# Patient Record
Sex: Female | Born: 1965 | Race: White | Hispanic: No | Marital: Married | Smoking: Never smoker
Health system: Southern US, Community
[De-identification: ages and names within clinical notes are randomized; demographics above are authoritative.]

## PROBLEM LIST (undated history)

## (undated) DIAGNOSIS — E039 Hypothyroidism, unspecified: Secondary | ICD-10-CM

## (undated) DIAGNOSIS — E785 Hyperlipidemia, unspecified: Secondary | ICD-10-CM

## (undated) DIAGNOSIS — R002 Palpitations: Secondary | ICD-10-CM

## (undated) HISTORY — DX: Hypothyroidism, unspecified: E03.9

## (undated) HISTORY — DX: Hyperlipidemia, unspecified: E78.5

## (undated) HISTORY — DX: Palpitations: R00.2

---

## 2002-01-04 ENCOUNTER — Encounter: Payer: Self-pay | Admitting: Obstetrics and Gynecology

## 2002-01-04 ENCOUNTER — Encounter: Admission: RE | Admit: 2002-01-04 | Discharge: 2002-01-04 | Payer: Self-pay | Admitting: Obstetrics and Gynecology

## 2005-07-18 ENCOUNTER — Encounter: Admission: RE | Admit: 2005-07-18 | Discharge: 2005-07-18 | Payer: Self-pay | Admitting: Obstetrics and Gynecology

## 2006-09-25 ENCOUNTER — Encounter: Admission: RE | Admit: 2006-09-25 | Discharge: 2006-09-25 | Payer: Self-pay | Admitting: Obstetrics and Gynecology

## 2008-03-29 IMAGING — MG MM DIGITAL SCREENING
4 series · 4 of 4 positions shown · non-contrast
Comparison: none

DG SCREEN MAMMOGRAM BILATERAL
Bilateral CC and MLO view(s) were taken.
Technologist: Will-Fried Yunzuguru
Prior study comparison: January 04, 2002, bilateral screening mammogram, performed at [facility] [REDACTED].

DIGITAL SCREENING MAMMOGRAM WITH CAD:
There are scattered fibroglandular densities.  There is no dominant mass, architectural distortion 
or calcification to suggest malignancy.

[R CC]
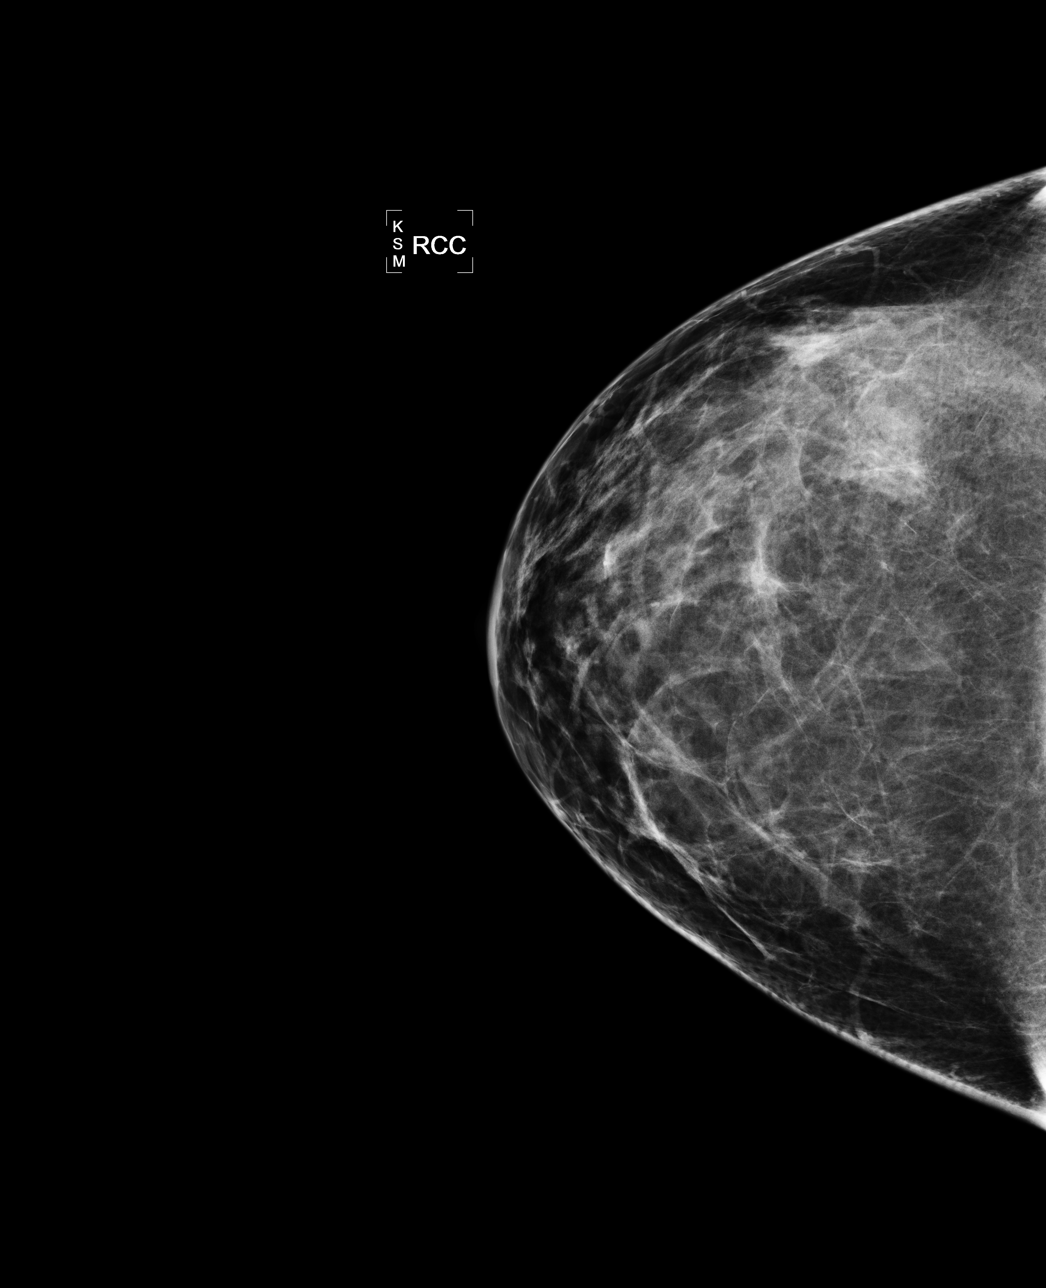

[L CC]
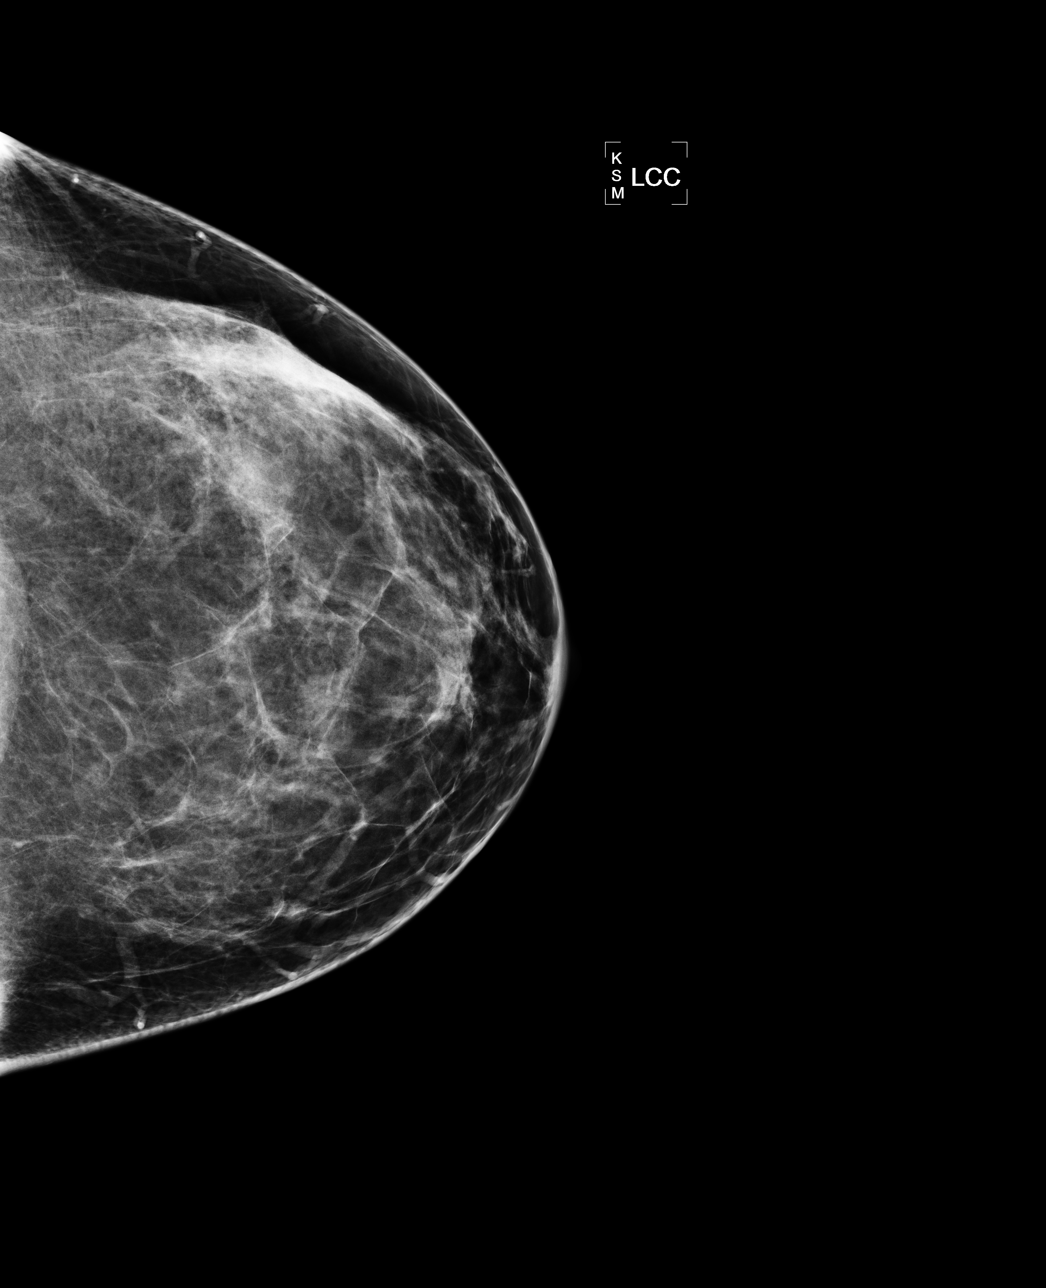

[L MLO]
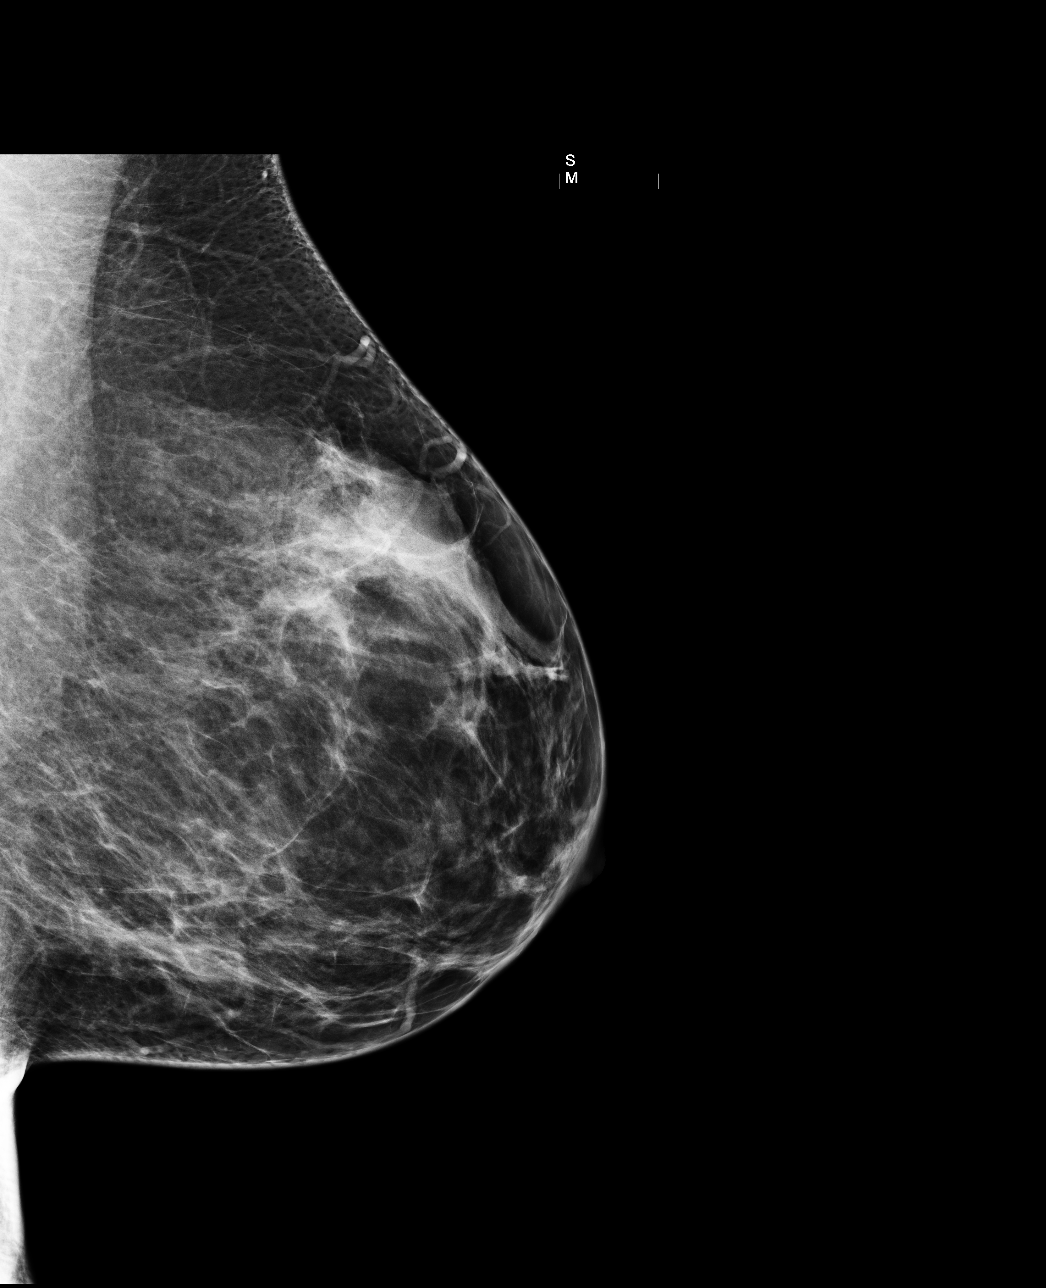

[R MLO]
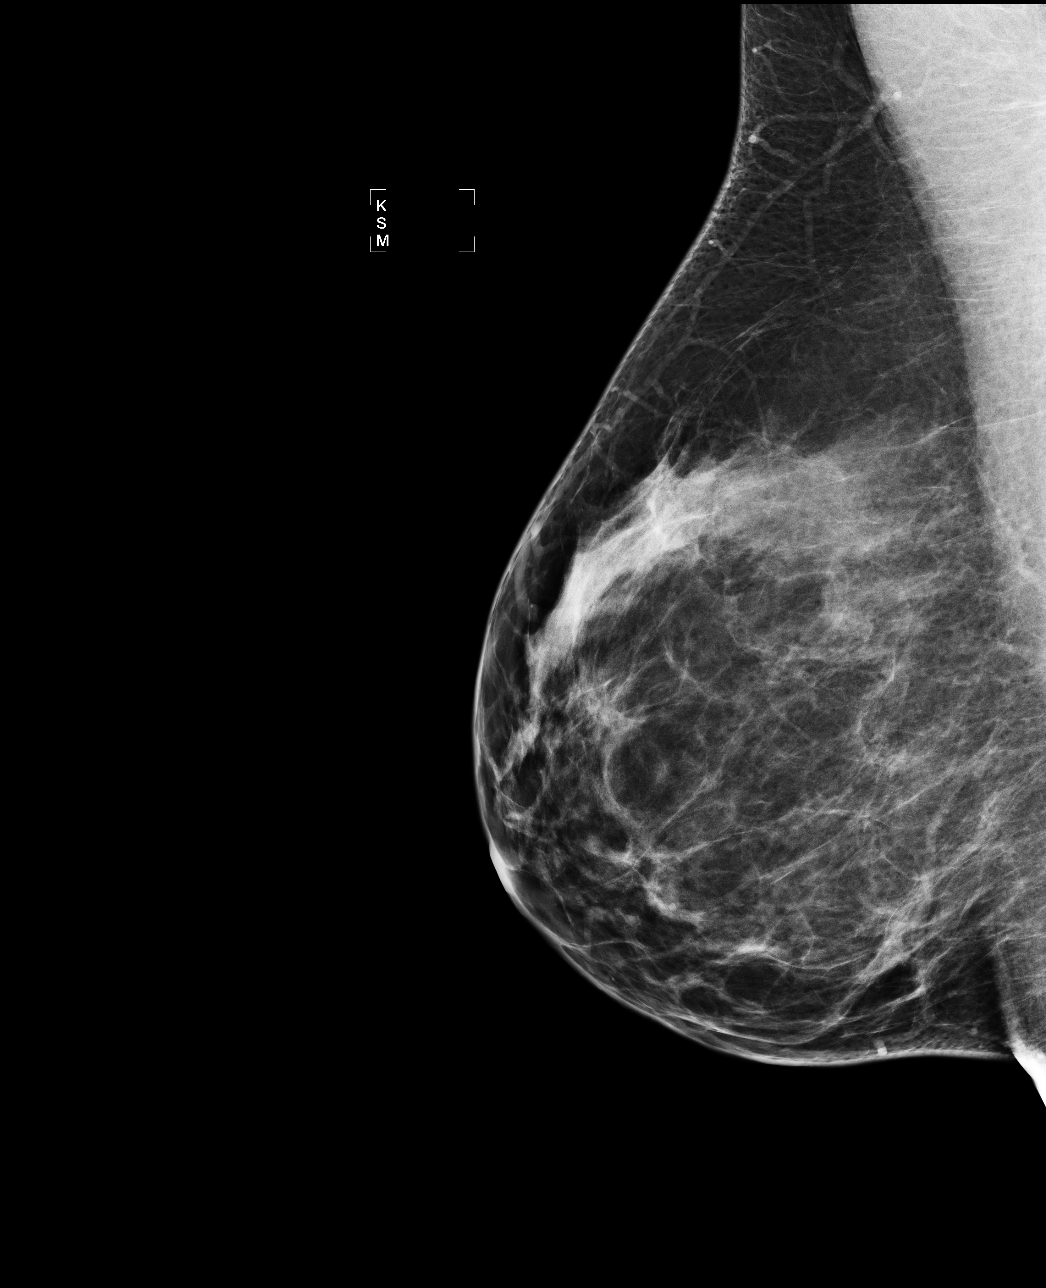

[4 of 4 positions shown; findings below may reference images not displayed]

IMPRESSION: No mammographic evidence of malignancy.  Suggest yearly screening mammography.

ASSESSMENT: Negative - BI-RADS 1

Screening mammogram in 1 year.
THIS WAS ANALAYZED BY COMPUTER AIDED DETECTION. , THIS PROCEDURE WAS A DIGITAL MAMMOGRAM.

## 2008-04-22 ENCOUNTER — Encounter: Admission: RE | Admit: 2008-04-22 | Discharge: 2008-04-22 | Payer: Self-pay | Admitting: Obstetrics and Gynecology

## 2009-10-25 IMAGING — MG MM DIAGNOSTIC UNILATERAL L
3 series · 3 of 3 positions shown · non-contrast
Comparison: 04/11/2008 [REDACTED], 09/25/2006

CLINICAL DATA: Screening callback for questioned left distortion

DIGITAL DIAGNOSTIC LEFT MAMMOGRAM WITH CAD

[L CC]
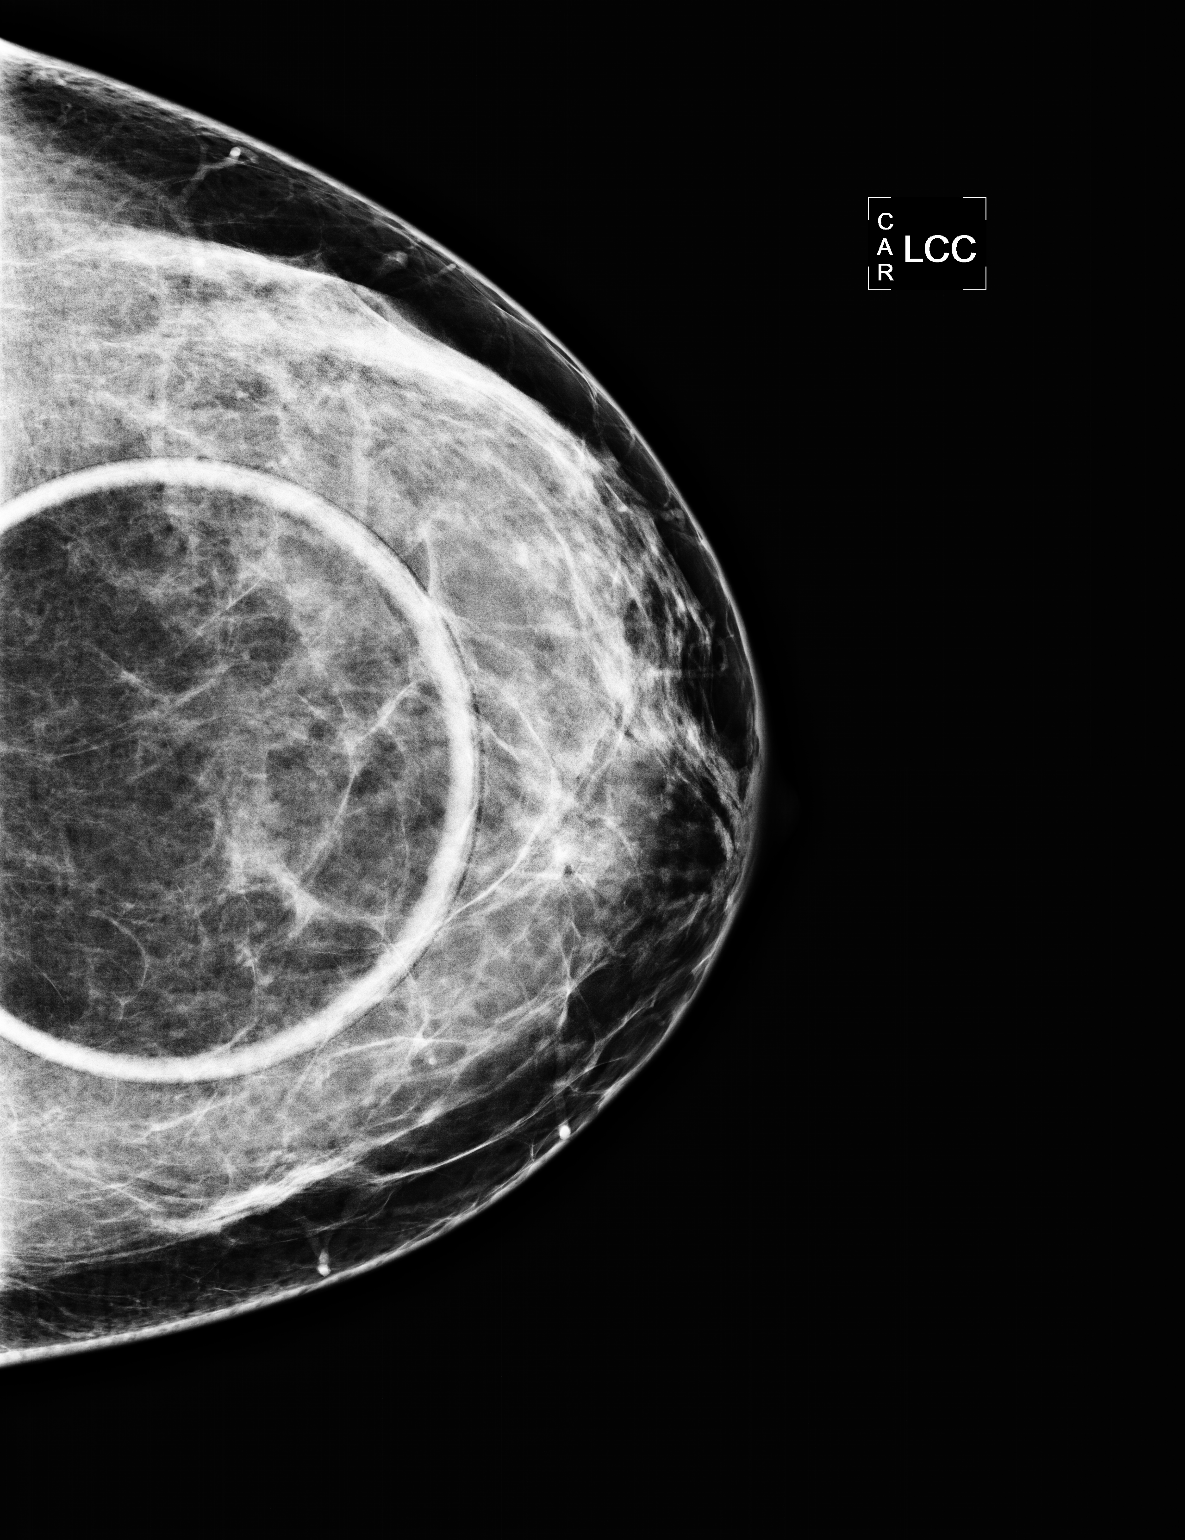

[L ML]
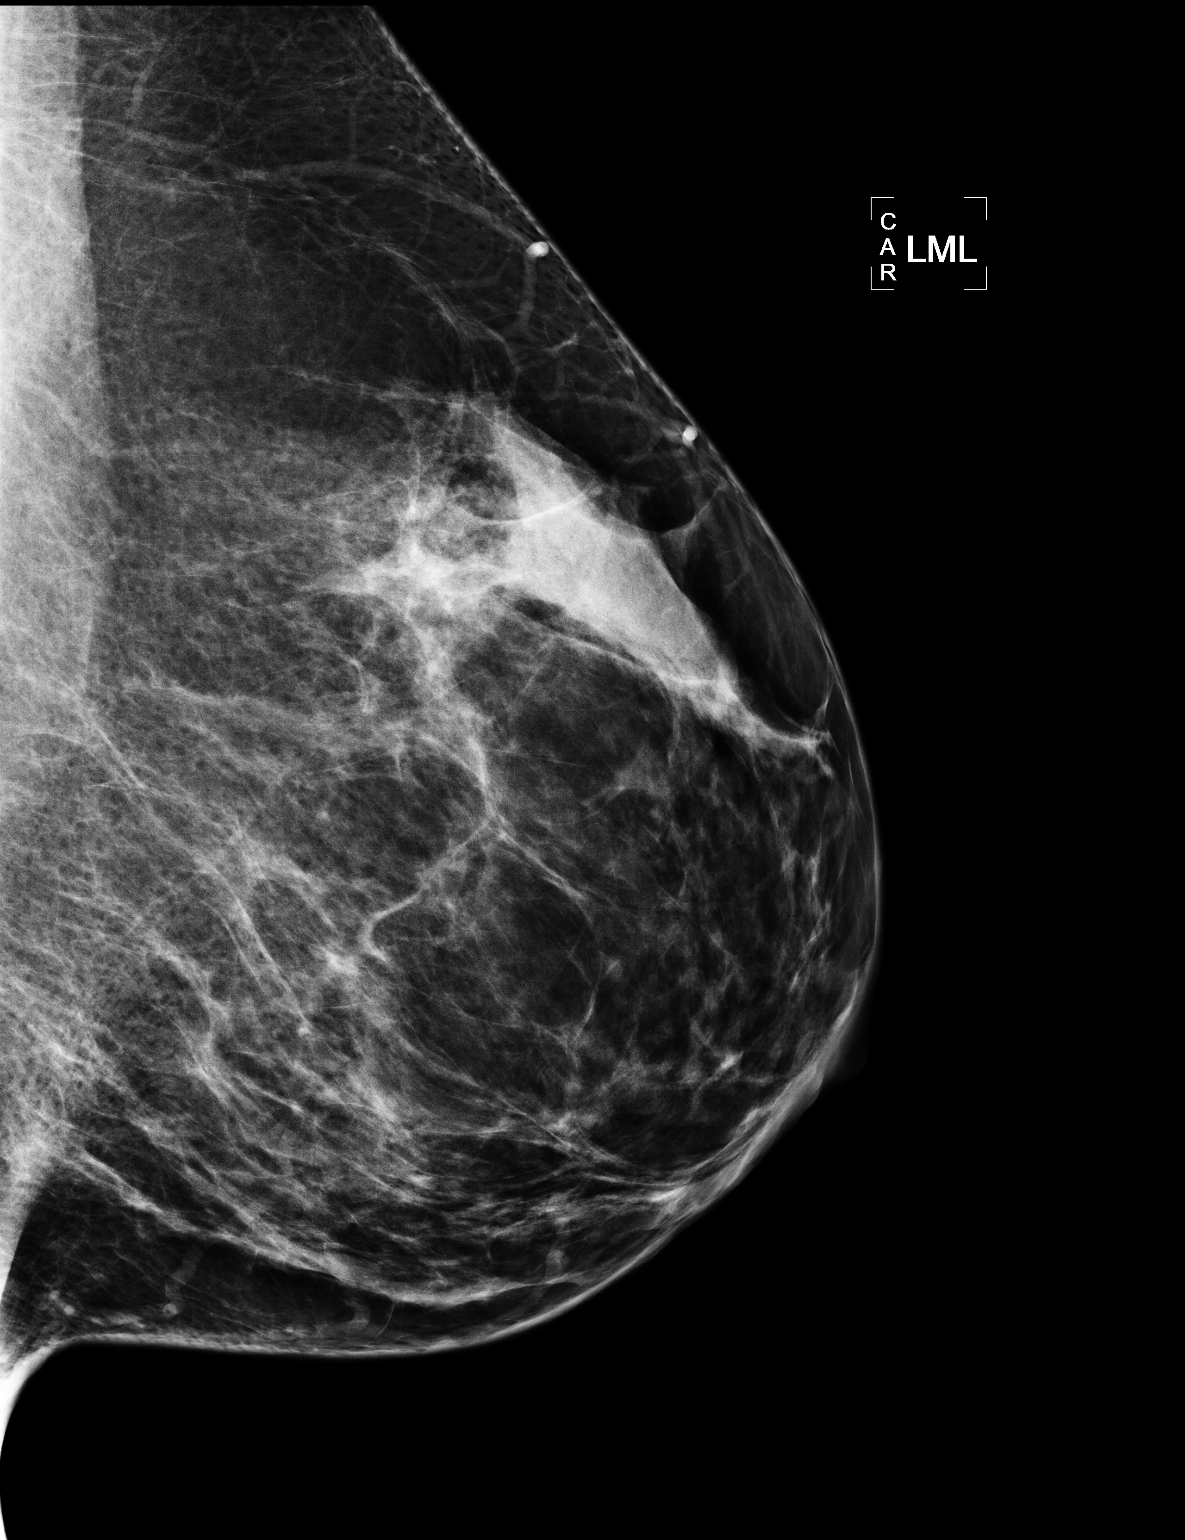

[L RM]
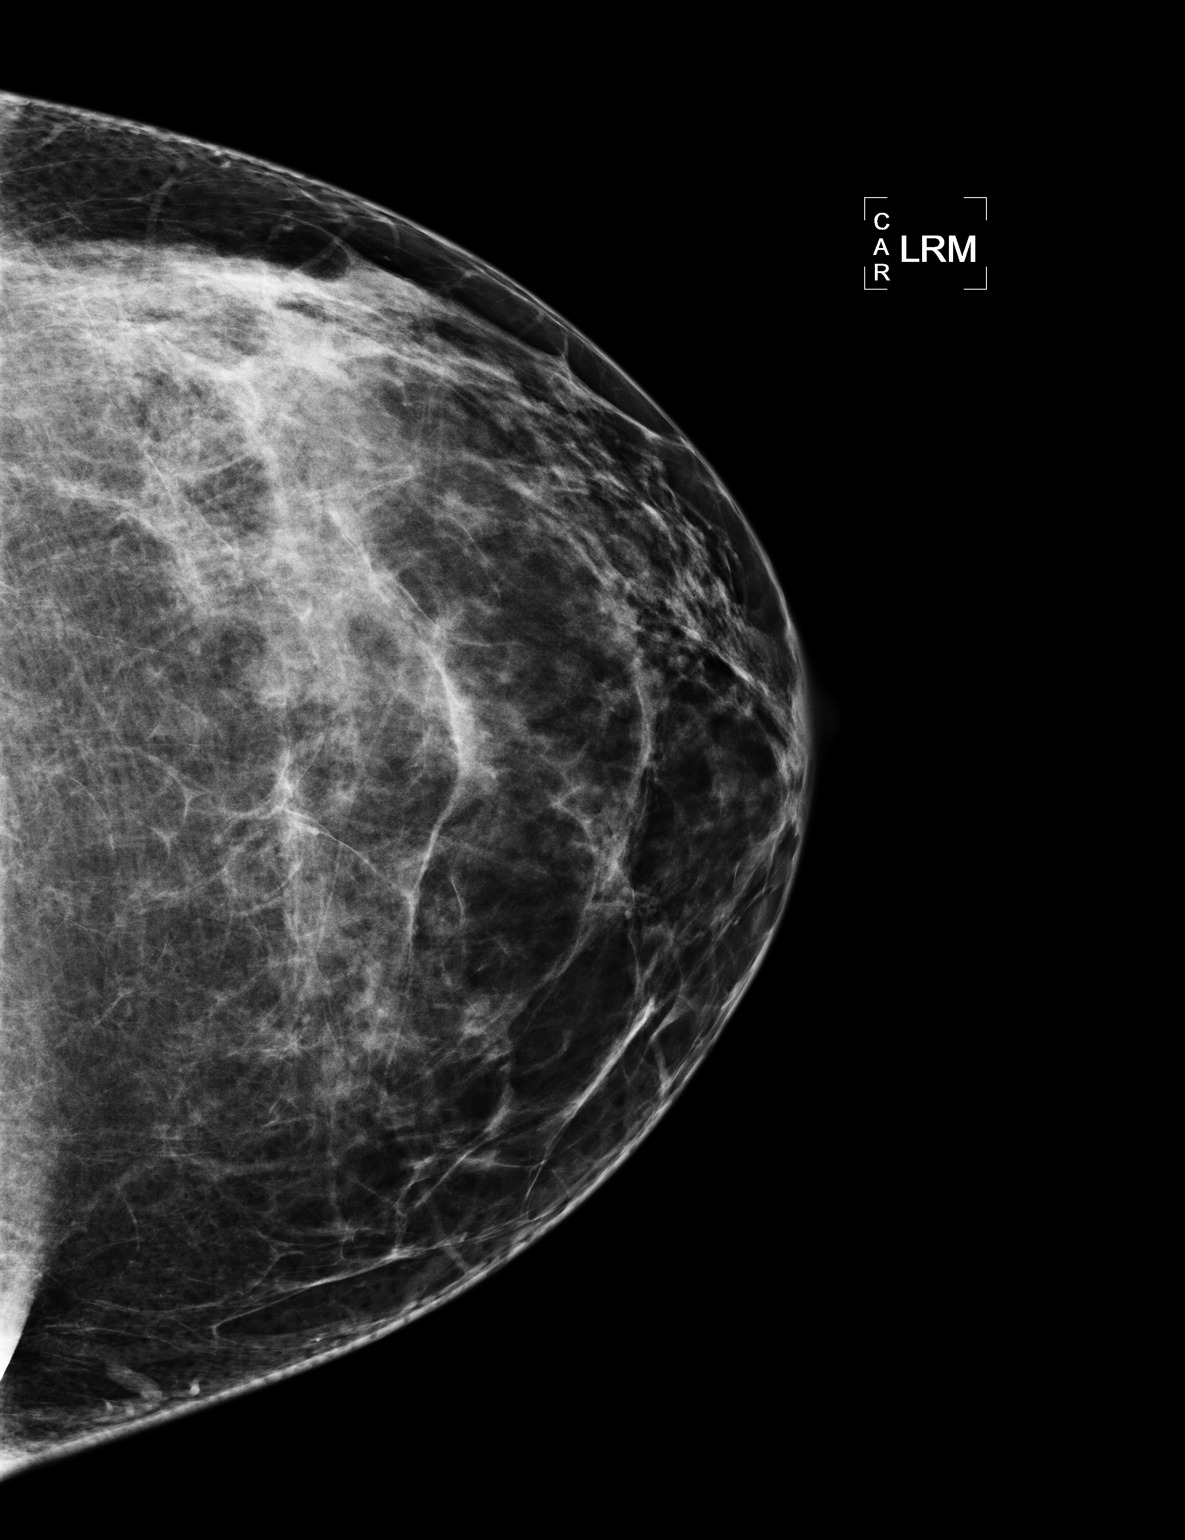

[3 of 3 positions shown; findings below may reference images not displayed]

FINDINGS: The questioned area of distortion in the central left
breast is not reproduced on additional imaging.  No worrisome
finding is seen.
IMPRESSION: No mammographic evidence for malignancy in the left breast.
Screening mammography is recommended in one year. Findings and
recommendations discussed with the patient and provided in written
form at the time of the exam.

BI-RADS CATEGORY 1:  Negative.

## 2010-05-30 ENCOUNTER — Encounter: Payer: Self-pay | Admitting: Obstetrics and Gynecology

## 2015-05-19 ENCOUNTER — Encounter: Payer: Self-pay | Admitting: Cardiovascular Disease

## 2015-05-26 ENCOUNTER — Encounter: Payer: Self-pay | Admitting: Cardiovascular Disease

## 2015-05-26 ENCOUNTER — Ambulatory Visit (INDEPENDENT_AMBULATORY_CARE_PROVIDER_SITE_OTHER): Payer: BLUE CROSS/BLUE SHIELD | Admitting: Cardiovascular Disease

## 2015-05-26 VITALS — BP 116/84 | HR 73 | Ht 66.0 in | Wt 217.8 lb

## 2015-05-26 DIAGNOSIS — E785 Hyperlipidemia, unspecified: Secondary | ICD-10-CM | POA: Diagnosis not present

## 2015-05-26 DIAGNOSIS — R002 Palpitations: Secondary | ICD-10-CM

## 2015-05-26 NOTE — Progress Notes (Signed)
Cardiology Office Note   Date:  05/26/2015   ID:  Leah Sanchez, DOB 1965/09/06, MRN [id number]  PCP:  Leah Hoff, MD  Cardiologist:   Leah Mixer, MD   Chief Complaint  Patient presents with  . Tachycardia      History of Present Illness: Leah Sanchez is a 50 y.o. female who presents for evaluation of tachypalpitations . Has had them for years.  Improved when she cut her caffeine intake Has occasional palpitations , on one occasion thought that her heart stopped for 2-3 seconds.  Has them frequently now,  Usually when she is relaxing at home.    Woke her up on one occasion   Does not get regular exercise.   She has not CP , dyspnea, syncope with exertional activities.  Works at [employer] ( works for [employer])  Gets migraine HR with aura.    Brother has a bicuspid AV valve ( and his daughter does as well )     Past Medical History  Diagnosis Date  . Palpitations   . Hyperlipemia   . Hypothyroid     No past surgical history on file.   Current Outpatient Prescriptions  Medication Sig Dispense Refill  . atorvastatin (LIPITOR) 20 MG tablet Take 20 mg by mouth daily.    . cefdinir (OMNICEF) 300 MG capsule Take 300 mg by mouth 2 (two) times daily as needed.    Marland Kitchen FLUARIX QUADRIVALENT 0.5 ML injection Inject as directed once.  0  . levothyroxine (SYNTHROID, LEVOTHROID) 25 MCG tablet Take 25 mcg by mouth daily before breakfast.    . Multiple Vitamin (MULTIVITAMIN) capsule Take 1 capsule by mouth daily.    Marland Kitchen omeprazole (PRILOSEC) 40 MG capsule Take 40 mg by mouth daily.    . Vitamin D, Ergocalciferol, (DRISDOL) 50000 units CAPS capsule Take 50,000 Units by mouth every 7 (seven) days.     No current facility-administered medications for this visit.    Allergies:   Zithromax    Social History:  The patient  reports that she has never smoked. She does not have any smokeless tobacco history on file. She reports that she drinks alcohol. She reports that  she does not use illicit drugs.   Family History:  The patient's family history includes Valvular heart disease in her brother.    ROS:  Please see the history of present illness.    Review of Systems: Constitutional:  denies fever, chills, diaphoresis, appetite change and fatigue.  HEENT: denies photophobia, eye pain, redness, hearing loss, ear pain, congestion, sore throat, rhinorrhea, sneezing, neck pain, neck stiffness and tinnitus.  Respiratory: denies SOB, DOE, cough, chest tightness, and wheezing.  Cardiovascular: denies chest pain, palpitations and leg swelling.  Gastrointestinal: denies nausea, vomiting, abdominal pain, diarrhea, constipation, blood in stool.  Genitourinary: denies dysuria, urgency, frequency, hematuria, flank pain and difficulty urinating.  Musculoskeletal: denies  myalgias, back pain, joint swelling, arthralgias and gait problem.   Skin: denies pallor, rash and wound.  Neurological: denies dizziness, seizures, syncope, weakness, light-headedness, numbness and headaches.   Hematological: denies adenopathy, easy bruising, personal or family bleeding history.  Psychiatric/ Behavioral: denies suicidal ideation, mood changes, confusion, nervousness, sleep disturbance and agitation.       All other systems are reviewed and negative.    PHYSICAL EXAM: VS:  BP 116/84 mmHg  Pulse 73  Ht  (1.676 m)  Wt 217 lb 12.8 oz (98.793 kg)  BMI 35.17 kg/m2 , BMI Body mass index  is 35.17 kg/(m^2). GEN: Well nourished, well developed, in no acute distress HEENT: normal Neck: no JVD, carotid bruits, or masses Cardiac: RRR; no murmurs, rubs, or gallops,no edema  Respiratory:  clear to auscultation bilaterally, normal work of breathing GI: soft, nontender, nondistended, + BS MS: no deformity or atrophy Skin: warm and dry, no rash Neuro:  Strength and sensation are intact Psych: normal   EKG:  EKG is ordered today. The ekg ordered today demonstrates  NSR at 73.   Normal eCG   Recent Labs: No results found for requested labs within last 365 days.    Lipid Panel No results found for: CHOL, TRIG, HDL, CHOLHDL, VLDL, LDLCALC, LDLDIRECT    Wt Readings from Last 3 Encounters:  05/26/15 217 lb 12.8 oz (98.793 kg)      Other studies Reviewed: Additional studies/ records that were reviewed today include: . Review of the above records demonstrates:    ASSESSMENT AND PLAN:  1.   Palpitations: she's having palpitations this seemed to be worsening slightly. Some of her palpitations are consistent with premature ventricular contractions but otherwise some more consistent with supraventricular tachycardia. We will place a 30 day event monitor on her for further evaluation. At this point I think that these palpitations will be benign. She does not have any associated chest pain or shortness of breath.  She was exercising last summer. I've encouraged her to get back into a regular exercise regimen. I think that weight loss will deftly help her.  I'll see her again in 3 months for follow-up office visit.   Current medicines are reviewed at length with the patient today.  The patient does not have concerns regarding medicines.  The following changes have been made:  no change  Labs/ tests ordered today include:   Orders Placed This Encounter  Procedures  . Cardiac event monitor  . EKG 12-Lead     Disposition:   FU with me in 3 months      Shalinda Burkholder, Deloris Ping, MD  05/26/2015 12:16 PM    [facility] [address] [place], [place], [place]  [address] Phone: [phone]; Fax: [phone]   [facility] [place]  [address] [place], [place]  [address] [phone]   Fax [phone]

## 2015-05-26 NOTE — Patient Instructions (Signed)
Medication Instructions:  Your physician recommends that you continue on your current medications as directed. Please refer to the Current Medication list given to you today.   Labwork: None Ordered   Testing/Procedures: Your physician has recommended that you wear an event monitor. Event monitors are medical devices that record the heart's electrical activity. Doctors most often us these monitors to diagnose arrhythmias. Arrhythmias are problems with the speed or rhythm of the heartbeat. The monitor is a small, portable device. You can wear one while you do your normal daily activities. This is usually used to diagnose what is causing palpitations/syncope (passing out).   Follow-Up: Your physician recommends that you schedule a follow-up appointment in: 3 months with Dr. Nahser   If you need a refill on your cardiac medications before your next appointment, please call your pharmacy.   Thank you for choosing [facility]! Jamauri Kruzel, RN [phone]    

## 2015-05-27 ENCOUNTER — Ambulatory Visit (INDEPENDENT_AMBULATORY_CARE_PROVIDER_SITE_OTHER): Payer: BLUE CROSS/BLUE SHIELD

## 2015-05-27 DIAGNOSIS — R002 Palpitations: Secondary | ICD-10-CM | POA: Diagnosis not present

## 2015-05-29 ENCOUNTER — Encounter: Payer: Self-pay | Admitting: Cardiovascular Disease

## 2015-08-27 ENCOUNTER — Ambulatory Visit: Payer: BLUE CROSS/BLUE SHIELD | Admitting: Cardiovascular Disease

## 2015-09-22 ENCOUNTER — Ambulatory Visit (INDEPENDENT_AMBULATORY_CARE_PROVIDER_SITE_OTHER): Payer: BLUE CROSS/BLUE SHIELD | Admitting: Cardiovascular Disease

## 2015-09-22 ENCOUNTER — Encounter: Payer: Self-pay | Admitting: Cardiovascular Disease

## 2015-09-22 VITALS — BP 120/80 | HR 64 | Ht 66.0 in | Wt 213.0 lb

## 2015-09-22 DIAGNOSIS — R002 Palpitations: Secondary | ICD-10-CM

## 2015-09-22 NOTE — Patient Instructions (Signed)
Medication Instructions:  Your physician recommends that you continue on your current medications as directed. Please refer to the Current Medication list given to you today.   Labwork: None Ordered   Testing/Procedures: None Ordered   Follow-Up: Your physician recommends that you schedule a follow-up appointment in: as needed with Dr. Nahser   If you need a refill on your cardiac medications before your next appointment, please call your pharmacy.   Thank you for choosing [facility]! Dane Kopke, RN [phone]    

## 2015-09-22 NOTE — Progress Notes (Signed)
Cardiology Office Note   Date:  09/22/2015   ID:  Leah BathKaren W Cordoba, DOB 1965-07-16, MRN [id number]  PCP:  Sissy HoffSWAYNE,DAVID W, MD  Cardiologist:   Kristeen MissPhilip Hyla Coard, MD   Chief Complaint  Patient presents with  . Follow-up    palpitations      History of Present Illness: Leah Sanchez is a 50 y.o. female who presents for evaluation of tachypalpitations . Has had them for years.  Improved when she cut her caffeine intake Has occasional palpitations , on one occasion thought that her heart stopped for 2-3 seconds.  Has them frequently now,  Usually when she is relaxing at home.    Woke her up on one occasion   Does not get regular exercise.   She has not CP , dyspnea, syncope with exertional activities.  Works at [employer] ( works for [employer])  Gets migraine HR with aura.    Brother has a bicuspid AV valve ( and his daughter does as well )   May 16,2017:  Doing well Trying to get regular exercise  No CP or dyspnea.   Past Medical History  Diagnosis Date  . Palpitations   . Hyperlipemia   . Hypothyroid     No past surgical history on file.   Current Outpatient Prescriptions  Medication Sig Dispense Refill  . atorvastatin (LIPITOR) 20 MG tablet Take 20 mg by mouth daily.    . cefdinir (OMNICEF) 300 MG capsule Take 300 mg by mouth 2 (two) times daily as needed.    Marland Kitchen. FLUARIX QUADRIVALENT 0.5 ML injection Inject as directed once.  0  . levothyroxine (SYNTHROID, LEVOTHROID) 25 MCG tablet Take 25 mcg by mouth daily before breakfast.    . Multiple Vitamin (MULTIVITAMIN) capsule Take 1 capsule by mouth daily.    Marland Kitchen. omeprazole (PRILOSEC) 40 MG capsule Take 40 mg by mouth daily.    . Vitamin D, Ergocalciferol, (DRISDOL) 50000 units CAPS capsule Take 50,000 Units by mouth every 7 (seven) days.     No current facility-administered medications for this visit.    Allergies:   Zithromax    Social History:  The patient  reports that she has never smoked. She does not have any  smokeless tobacco history on file. She reports that she drinks alcohol. She reports that she does not use illicit drugs.   Family History:  The patient's family history includes Valvular heart disease in her brother.    ROS:  Please see the history of present illness.    Review of Systems: Constitutional:  denies fever, chills, diaphoresis, appetite change and fatigue.  HEENT: denies photophobia, eye pain, redness, hearing loss, ear pain, congestion, sore throat, rhinorrhea, sneezing, neck pain, neck stiffness and tinnitus.  Respiratory: denies SOB, DOE, cough, chest tightness, and wheezing.  Cardiovascular: denies chest pain, palpitations and leg swelling.  Gastrointestinal: denies nausea, vomiting, abdominal pain, diarrhea, constipation, blood in stool.  Genitourinary: denies dysuria, urgency, frequency, hematuria, flank pain and difficulty urinating.  Musculoskeletal: denies  myalgias, back pain, joint swelling, arthralgias and gait problem.   Skin: denies pallor, rash and wound.  Neurological: denies dizziness, seizures, syncope, weakness, light-headedness, numbness and headaches.   Hematological: denies adenopathy, easy bruising, personal or family bleeding history.  Psychiatric/ Behavioral: denies suicidal ideation, mood changes, confusion, nervousness, sleep disturbance and agitation.       All other systems are reviewed and negative.    PHYSICAL EXAM: VS:  BP 120/80 mmHg  Pulse 64  Ht 5\' 6"  (1.676  m)  Wt 213 lb (96.616 kg)  BMI 34.40 kg/m2 , BMI Body mass index is 34.4 kg/(m^2). GEN: Well nourished, well developed, in no acute distress HEENT: normal Neck: no JVD, carotid bruits, or masses Cardiac: RRR; no murmurs, rubs, or gallops,no edema  Respiratory:  clear to auscultation bilaterally, normal work of breathing GI: soft, nontender, nondistended, + BS MS: no deformity or atrophy Skin: warm and dry, no rash Neuro:  Strength and sensation are intact Psych:  normal   EKG:  EKG is ordered today. The ekg ordered today demonstrates  NSR at 73.  Normal eCG   Recent Labs: No results found for requested labs within last 365 days.    Lipid Panel No results found for: CHOL, TRIG, HDL, CHOLHDL, VLDL, LDLCALC, LDLDIRECT    Wt Readings from Last 3 Encounters:  09/22/15 213 lb (96.616 kg)  05/26/15 217 lb 12.8 oz (98.793 kg)      Other studies Reviewed: Additional studies/ records that were reviewed today include: . Review of the above records demonstrates:    ASSESSMENT AND PLAN:  1.   Palpitations:  She has not had any serious palpitations in the past several months.  The event monitor did not reveal any significant arrhythmias. At this point, I suspect that she does not have a serious cardiac condition. Likely has PACs or perhaps PVCs.  I will see her on an as needed basis   Current medicines are reviewed at length with the patient today.  The patient does not have concerns regarding medicines.  The following changes have been made:  no change  Labs/ tests ordered today include:   No orders of the defined types were placed in this encounter.     Disposition:   FU with me as needed     Kristeen Miss, MD  09/22/2015 4:55 PM    [facility] [address] [place], [place], [place]  [address] Phone: [phone]; Fax: [phone]   [facility]  [address] [place], [place]  [address] [phone]   Fax [phone]

## 2021-05-04 ENCOUNTER — Other Ambulatory Visit: Payer: Self-pay

## 2021-05-04 ENCOUNTER — Encounter: Payer: Self-pay | Admitting: Allergy

## 2021-05-04 ENCOUNTER — Ambulatory Visit: Payer: BC Managed Care – PPO | Admitting: Allergy

## 2021-05-04 VITALS — BP 122/68 | HR 85 | Temp 98.1°F | Resp 18 | Ht 68.0 in | Wt 230.0 lb

## 2021-05-04 DIAGNOSIS — T781XXA Other adverse food reactions, not elsewhere classified, initial encounter: Secondary | ICD-10-CM

## 2021-05-04 DIAGNOSIS — R12 Heartburn: Secondary | ICD-10-CM | POA: Insufficient documentation

## 2021-05-04 DIAGNOSIS — T781XXD Other adverse food reactions, not elsewhere classified, subsequent encounter: Secondary | ICD-10-CM | POA: Insufficient documentation

## 2021-05-04 DIAGNOSIS — Z872 Personal history of diseases of the skin and subcutaneous tissue: Secondary | ICD-10-CM | POA: Insufficient documentation

## 2021-05-04 DIAGNOSIS — Z0182 Encounter for allergy testing: Secondary | ICD-10-CM | POA: Diagnosis not present

## 2021-05-04 MED ORDER — EPINEPHRINE 0.3 MG/0.3ML IJ SOAJ: 0.3000 mg | INTRAMUSCULAR | 2 refills | Status: DC | PRN

## 2021-05-04 NOTE — Patient Instructions (Addendum)
Today's skin testing showed: Negative to wheat and saccharomyces cerevisiae. Results given.   Food allergies Start strict avoidance of sourdough products.  Okay to eat wheat products.  I have prescribed epinephrine injectable device and demonstrated proper use. For mild symptoms you can take over the counter antihistamines such as Benadryl and monitor symptoms closely. If symptoms worsen or if you have severe symptoms including breathing issues, throat closure, significant swelling, whole body hives, severe diarrhea and vomiting, lightheadedness then inject epinephrine and seek immediate medical care afterwards. Emergency action plan given.  Heartburn: See handout for lifestyle and dietary modifications.  Follow up in 12 months or sooner if needed.

## 2021-05-04 NOTE — Assessment & Plan Note (Addendum)
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 Symptoms resolved with benadryl. Previously tolerated with no issues. Since then had wheat and other bread with no issues. Saw GI and had EGD that showed gastritis?   Today's skin testing showed: Negative to wheat and saccharomyces cerevisiae.  Discussed with patient that no test for "sourdough" available.   Start strict avoidance of sourdough products as she seems to have some type of intolerance to it.   Okay to eat wheat products.  I have prescribed epinephrine injectable device and demonstrated proper use. For mild symptoms you can take over the counter antihistamines such as Benadryl and monitor symptoms closely. If symptoms worsen or if you have severe symptoms including breathing issues, throat closure, significant swelling, whole body hives, severe diarrhea and vomiting, lightheadedness then inject epinephrine and seek immediate medical care afterwards.  Emergency action plan given.  Patient questioning trying a low histamine diet - may try but I'm not sure if she will notice any clinical benefit from it.

## 2021-05-04 NOTE — Progress Notes (Signed)
New Patient Note  RE: Leah Sanchez MRN: [id number] DOB: 07/13/1965 Date of Office Visit: 05/04/2021  Consult requested by: Merri Brunette, MD Primary care provider: Merri Brunette, MD  Chief Complaint: Allergic Reaction (SEPT THROAT SWELLING AFTER EATING SOURDOUGH BREAD ALSO HAPPENED WITH BROCHETTE BREAD BENADRYL HELP WITH SYMPTOMS//RECENTLY HUSBAND WAS FRYING SOURDOUGH AND PT FELT DIZZY AND LIGHT HEADED. )  History of Present Illness: I had the pleasure of seeing Leah Sanchez for initial evaluation at the [facility] on 05/04/2021. She is a 55 y.o. female, who is referred here by Merri Brunette, MD for the evaluation of food allergy.  She reports reaction to sourdough bread. The reaction occurred in September 2022.  Her friend brought some sourdough bread which she had no issues with. Then she made her own sourdough bread from the started. She noted a little discomfort in her throat after a small bite but did not think much of it.   The next day she had a slice and noted some throat tightness and nausea.  She took benadryl and after 40 minutes she felt fine.  She noted some stomach irritation that week after eating the sourdough bread on a consistent basis.  She went to UC and was started on PPI but the PPI caused some achy legs so she stopped it. The PCP referred to GI and had EGD with biopsy which showed some inflammation/gastritis per patient report in October 2022. Denies any eosinophils - no report available during OV.  She has history of having stomach issues.   She had a bite of pumpkin brioche bread from [employer] and noted some throat tightness and stomach felt weird. She drank some water and the symptoms settled down.  Her husband was frying a piece of sourdough bread and egg. The smell of it caused some dizziness and lightheadedness. She took 2 benadryls and it took about 45 minutes for symptoms to improve.   Denies any hives, itching, vomiting,  diarrhea.  Denies any associated cofactors such as exertion, infection, NSAID use, or alcohol consumption.   Previously tolerated sourdough bread with no issues. She also had some cookies and minimal regular bread with no issues since then.   Patient usually does not drink beer.   Past work up includes: none. Dietary History: patient has been eating other foods including milk, eggs, peanut, treenuts, sesame, shellfish, fish, soy, wheat, meats, fruits and vegetables.  She reports reading labels and avoiding sourdough in diet completely.   Patient questions if she could have some type of histamine issue and wonders if a low histamine diet would benefit her.  Assessment and Plan: Miel is a 55 y.o. female with: Other adverse food reactions, not elsewhere classified, subsequent encounter Concerned about allergy to sourdough bread as she noticed throat tightness and nausea after eating on several occasions. Symptoms resolved with benadryl. Previously tolerated with no issues. Since then had wheat and other bread with no issues. Saw GI and had EGD that showed gastritis?  Today's skin testing showed: Negative to wheat and saccharomyces cerevisiae. Discussed with patient that no test for "sourdough" available.  Start strict avoidance of sourdough products as she seems to have some type of intolerance to it.  Okay to eat wheat products. I have prescribed epinephrine injectable device and demonstrated proper use. For mild symptoms you can take over the counter antihistamines such as Benadryl and monitor symptoms closely. If symptoms worsen or if you have severe symptoms including breathing issues, throat closure,  significant swelling, whole body hives, severe diarrhea and vomiting, lightheadedness then inject epinephrine and seek immediate medical care afterwards. Emergency action plan given. Patient questioning trying a low histamine diet - may try but I'm not sure if she will notice any clinical  benefit from it.   Heartburn See handout for lifestyle and dietary modifications.  Return in about 1 year (around 05/04/2022).  Meds ordered this encounter  Medications   EPINEPHrine 0.3 mg/0.3 mL IJ SOAJ injection    Sig: Inject 0.3 mg into the muscle as needed for anaphylaxis.    Dispense:  1 each    Refill:  2    May dispense generic/Mylan/Teva brand.   Lab Orders  No laboratory test(s) ordered today    Other allergy screening: Asthma: no Rhino conjunctivitis: no Medication allergy: yes Hymenoptera allergy: no Urticaria:  After an URI and some other episodes with no known triggers. Eczema:no History of recurrent infections suggestive of immunodeficency: no  Diagnostics: Skin Testing: Select foods. Negative to wheat and saccharomyces cerevisiae. Results discussed with patient/family.  Food Adult Perc - 05/04/21 1100     Time Antigen Placed 1118    Allergen Manufacturer Waynette Buttery    Location Back    Number of allergen test 4     Control-buffer 50% Glycerol Negative    Control-Histamine 1 mg/ml 2+    3. Wheat Negative    36. Saccharomyces Cerevisiae  Negative             Past Medical History: Patient Active Problem List   Diagnosis Date Noted   Other adverse food reactions, not elsewhere classified, subsequent encounter 05/04/2021   Heartburn 05/04/2021   History of urticaria 05/04/2021   Palpitations 05/26/2015   Past Medical History:  Diagnosis Date   Hyperlipemia    Hypothyroid    Palpitations    Past Surgical History: History reviewed. No pertinent surgical history. Medication List:  Current Outpatient Medications  Medication Sig Dispense Refill   EPINEPHrine 0.3 mg/0.3 mL IJ SOAJ injection Inject 0.3 mg into the muscle as needed for anaphylaxis. 1 each 2   famotidine (PEPCID) 20 MG tablet Take 20 mg by mouth 2 (two) times daily.     levothyroxine (SYNTHROID, LEVOTHROID) 25 MCG tablet Take 25 mcg by mouth daily before breakfast.     Multiple  Vitamin (MULTIVITAMIN) capsule Take 1 capsule by mouth daily.     Vitamin D, Ergocalciferol, (DRISDOL) 50000 units CAPS capsule Take 50,000 Units by mouth every 7 (seven) days.     No current facility-administered medications for this visit.   Allergies: Allergies  Allergen Reactions   Atorvastatin    Cefdinir    Pantoprazole    Zithromax [Azithromycin]     GI distress: side effects   Social History: Social History   Socioeconomic History   Marital status: Married    Spouse name: Not on file   Number of children: 3   Years of education: Not on file   Highest education level: Not on file  Occupational History   Not on file  Tobacco Use   Smoking status: Never   Smokeless tobacco: Not on file  Substance and Sexual Activity   Alcohol use: Yes    Alcohol/week: 0.0 standard drinks   Drug use: No   Sexual activity: Not on file  Other Topics Concern   Not on file  Social History Narrative   Not on file   Social Determinants of Health   Financial Resource Strain: Not on file  Food  Insecurity: Not on file  Transportation Needs: Not on file  Physical Activity: Not on file  Stress: Not on file  Social Connections: Not on file   Lives in a house which is 56 years old. Smoking: denies Occupation: self employed  Environmental HistorySurveyor, minerals in the house: no Engineer, civil (consulting) in the family room: no Carpet in the bedroom: no Heating: gas Cooling: central Pet: no  Family History: Family History  Problem Relation Age of Onset   Valvular heart disease Brother        Bicuspid aortic valve    Problem                               Relation Asthma                                   Daughter  Eczema                                no Food allergy                          no Allergic rhino conjunctivitis     daughter   Review of Systems  Constitutional:  Negative for appetite change, chills, fever and unexpected weight change.  HENT:  Negative for congestion and  rhinorrhea.   Eyes:  Negative for itching.  Respiratory:  Negative for cough, chest tightness, shortness of breath and wheezing.   Cardiovascular:  Negative for chest pain.  Gastrointestinal:  Negative for abdominal pain.  Genitourinary:  Negative for difficulty urinating.  Skin:  Negative for rash.  Neurological:  Negative for headaches.   Objective: BP 122/68    Pulse 85    Temp 98.1 F (36.7 C) (Temporal)    Resp 18    Ht 5\' 8"  (1.727 m)    Wt 230 lb (104.3 kg)    SpO2 98%    BMI 34.97 kg/m  Body mass index is 34.97 kg/m. Physical Exam Vitals and nursing note reviewed.  Constitutional:      Appearance: Normal appearance. She is well-developed.  HENT:     Head: Normocephalic and atraumatic.     Right Ear: Tympanic membrane and external ear normal.     Left Ear: Tympanic membrane and external ear normal.     Nose: Nose normal.     Mouth/Throat:     Mouth: Mucous membranes are moist.     Pharynx: Oropharynx is clear.  Eyes:     Conjunctiva/sclera: Conjunctivae normal.  Cardiovascular:     Rate and Rhythm: Normal rate and regular rhythm.     Heart sounds: Normal heart sounds. No murmur heard.   No friction rub. No gallop.  Pulmonary:     Effort: Pulmonary effort is normal.     Breath sounds: Normal breath sounds. No wheezing, rhonchi or rales.  Musculoskeletal:     Cervical back: Neck supple.  Skin:    General: Skin is warm.     Findings: No rash.  Neurological:     Mental Status: She is alert and oriented to person, place, and time.  Psychiatric:        Behavior: Behavior normal.  The plan was reviewed with the patient/family, and all questions/concerned were addressed.  It was my pleasure  to see Davita today and participate in her care. Please feel free to contact me with any questions or concerns.  Sincerely,  Wyline Mood, DO Allergy & Immunology  [facility] office: [phone] [facility] office: [phone]

## 2021-05-04 NOTE — Assessment & Plan Note (Signed)
See handout for lifestyle and dietary modifications. 

## 2021-12-21 ENCOUNTER — Other Ambulatory Visit: Payer: Self-pay | Admitting: Physician Assistant

## 2021-12-21 DIAGNOSIS — R1010 Upper abdominal pain, unspecified: Secondary | ICD-10-CM

## 2021-12-28 ENCOUNTER — Ambulatory Visit
Admission: RE | Admit: 2021-12-28 | Discharge: 2021-12-28 | Disposition: A | Discharge status: Home / Self Care | Payer: BC Managed Care – PPO | Source: Ambulatory Visit | Attending: Physician Assistant | Admitting: Physician Assistant

## 2021-12-28 DIAGNOSIS — R1010 Upper abdominal pain, unspecified: Secondary | ICD-10-CM

## 2022-03-15 ENCOUNTER — Encounter: Payer: Self-pay | Admitting: Allergy

## 2022-03-15 ENCOUNTER — Ambulatory Visit: Payer: BC Managed Care – PPO | Admitting: Allergy

## 2022-03-15 VITALS — BP 124/82 | HR 79 | Temp 97.8°F | Resp 16 | Ht 67.0 in | Wt 228.2 lb

## 2022-03-15 DIAGNOSIS — T781XXA Other adverse food reactions, not elsewhere classified, initial encounter: Secondary | ICD-10-CM | POA: Diagnosis not present

## 2022-03-15 DIAGNOSIS — R12 Heartburn: Secondary | ICD-10-CM

## 2022-03-15 DIAGNOSIS — T781XXD Other adverse food reactions, not elsewhere classified, subsequent encounter: Secondary | ICD-10-CM

## 2022-03-15 MED ORDER — EPINEPHRINE 0.3 MG/0.3ML IJ SOAJ: 0.3000 mg | INTRAMUSCULAR | 2 refills | Status: AC | PRN

## 2022-03-15 NOTE — Patient Instructions (Addendum)
Today's skin testing showed: Negative to select foods. Results given.   Food allergies Continue to avoid sourdough products.  Concerning about egg intolerance. Keep track of symptoms and avoid foods that are bothersome.  For mild symptoms you can take over the counter antihistamines such as Benadryl and monitor symptoms closely. If symptoms worsen or if you have severe symptoms including breathing issues, throat closure, significant swelling, whole body hives, severe diarrhea and vomiting, lightheadedness then inject epinephrine and seek immediate medical care afterwards. Emergency action in place.   It is highly unlikely that you have any mast cell issues. You can try taking an over the counter antihistamines such as Zyrtec (cetirizine), Claritin (loratadine), Allegra (fexofenadine), or Xyzal (levocetirizine) daily. The bloodwork I usually get is called tryptase.   Follow up in 12 months or sooner if needed.

## 2022-03-15 NOTE — Assessment & Plan Note (Signed)
Past history - Concerned about allergy to sourdough bread as she noticed throat tightness and nausea after eating on several occasions. Symptoms resolved with benadryl. Previously tolerated with no issues. Since then had wheat and other bread with no issues. Saw GI and had EGD that showed gastritis? 2022 skin testing showed: Negative to wheat and saccharomyces cerevisiae. Interim history - had a few more episodes of throat tightness, palpitations and GI discomfort. 2 episodes with bread, 1 with egg and 1 with shrimp.  Today's skin testing showed: Negative to select foods. Continue to avoid sourdough products.  Concerning about egg intolerance. Keep track of symptoms and avoid foods that are bothersome. For mild symptoms you can take over the counter antihistamines such as Benadryl and monitor symptoms closely. If symptoms worsen or if you have severe symptoms including breathing issues, throat closure, significant swelling, whole body hives, severe diarrhea and vomiting, lightheadedness then inject epinephrine and seek immediate medical care afterwards. Emergency action plan given.   It is highly unlikely that she has any mast cell issues. Tryptase is the screening bloodwork I typically get for this.  You can try taking an over the counter antihistamines such as Zyrtec (cetirizine), Claritin (loratadine), Allegra (fexofenadine), or Xyzal (levocetirizine) daily.

## 2022-03-15 NOTE — Progress Notes (Signed)
Follow Up Note  RE: Leah Sanchez MRN: [id number] DOB: 1966/01/28 Date of Office Visit: 03/15/2022  Referring provider: Merri Brunette, MD Primary care provider: Merri Brunette, MD  Chief Complaint: Allergic Reaction (Ate a tiny piece of a baked good w egg and heart rate increased, stomach pain/Egg w cheese cause her gi issues,nausea,shaking,stomach pain /Smells cause her to get dizzy/Crescent roll:heart rate increased /Baked goods /Shrimp: increased heart rate, surge in body ) and Heartburn (Sometimes )  History of Present Illness: I had the pleasure of seeing Leah Sanchez for a follow up visit at the [facility] on 03/15/2022. She is a 56 y.o. female, who is being followed for adverse food reaction. Her previous allergy office visit was on 05/04/2021 with Dr. Selena Batten. Today is a regular follow up visit.  Food: Patient is avoiding sourdough bread.  She had a piece of brioche bread - she noted some throat tightness/swelling and symptoms resolved without meds. She didn't realize it had some sourdough in it.  Patient [place] crescent roll [place] the airport - noted palpitations, abdominal discomfort. This summer she had shrimp and a few minutes afterwards she had some palpitations.   A few weeks ago had some eggs with cheese and had some bloating, gassy, diarrhea, nausea. She had shaking for about 10 minutes. Patient did not take any meds for this.  She has been avoiding eggs since then. Patient has no issues with flour tortilla.  Dietary History: patient has been eating other foods including milk, peanut, treenuts, wheat, meats, fruits and vegetables.  No recent sesame, seafood ingestion. Not sure about soy.   Patient was diagnosed with gastritis last fall.  Not taking any daily antihistamines.  Wondering if she had any mast cell disorders - discussed with patient that unlikely given her clinical history.  Assessment and Plan: Leah Sanchez is a 56 y.o. female  with: Other adverse food reactions, not elsewhere classified, subsequent encounter Past history - Concerned about allergy to sourdough bread as she noticed throat tightness and nausea after eating on several occasions. Symptoms resolved with benadryl. Previously tolerated with no issues. Since then had wheat and other bread with no issues. Saw GI and had EGD that showed gastritis? 2022 skin testing showed: Negative to wheat and saccharomyces cerevisiae. Interim history - had a few more episodes of throat tightness, palpitations and GI discomfort. 2 episodes with bread, 1 with egg and 1 with shrimp.  Today's skin testing showed: Negative to select foods. Continue to avoid sourdough products.  Concerning about egg intolerance. Keep track of symptoms and avoid foods that are bothersome. For mild symptoms you can take over the counter antihistamines such as Benadryl and monitor symptoms closely. If symptoms worsen or if you have severe symptoms including breathing issues, throat closure, significant swelling, whole body hives, severe diarrhea and vomiting, lightheadedness then inject epinephrine and seek immediate medical care afterwards. Emergency action plan given.   It is highly unlikely that she has any mast cell issues. Tryptase is the screening bloodwork I typically get for this.  You can try taking an over the counter antihistamines such as Zyrtec (cetirizine), Claritin (loratadine), Allegra (fexofenadine), or Xyzal (levocetirizine) daily.  Return in about 1 year (around 03/16/2023).  Meds ordered this encounter  Medications   EPINEPHrine 0.3 mg/0.3 mL IJ SOAJ injection    Sig: Inject 0.3 mg into the muscle as needed for anaphylaxis.    Dispense:  1 each    Refill:  2    May dispense generic/Mylan/Teva brand.   Lab Orders  No laboratory test(s) ordered today    Diagnostics: Skin Testing: Select foods. Negative to select foods. Results discussed with patient/family.  Food Adult Perc  - 03/15/22 1600     Time Antigen Placed 1623    Allergen Manufacturer Lavella Hammock    Location Back    Number of allergen test 31     Control-buffer 50% Glycerol Negative    Control-Histamine 1 mg/ml 3+    1. Peanut Negative    2. Soybean Negative    3. Wheat Negative    4. Sesame Negative    5. Milk, cow Negative    6. Egg White, Chicken Negative    7. Casein Negative    8. Shellfish Mix Negative    9. Fish Mix Negative    10. Cashew Negative    11. Pecan Food Negative    12. Temple Negative    13. Almond Negative    14. Hazelnut Negative    15. [place] nut Negative    16. Coconut Negative    17. Pistachio Negative    18. Catfish Negative    19. Bass Negative    20. Trout Negative    21. Tuna Negative    22. Salmon Negative    23. Flounder Negative    24. Codfish Negative    25. Shrimp Negative    26. Crab Negative    27. Lobster Negative    28. Oyster Negative    29. Scallops Negative             Medication List:  Current Outpatient Medications  Medication Sig Dispense Refill   famotidine (PEPCID) 20 MG tablet Take 20 mg by mouth 2 (two) times daily.     levothyroxine (SYNTHROID, LEVOTHROID) 25 MCG tablet Take 25 mcg by mouth daily before breakfast.     Multiple Vitamin (MULTIVITAMIN) capsule Take 1 capsule by mouth daily.     Vitamin D, Ergocalciferol, (DRISDOL) 50000 units CAPS capsule Take 50,000 Units by mouth every 7 (seven) days.     EPINEPHrine 0.3 mg/0.3 mL IJ SOAJ injection Inject 0.3 mg into the muscle as needed for anaphylaxis. 1 each 2   No current facility-administered medications for this visit.   Allergies: Allergies  Allergen Reactions   Atorvastatin    Cefdinir    Pantoprazole    Zithromax [Azithromycin]     GI distress: side effects   I reviewed her past medical history, social history, family history, and environmental history and no significant changes have been reported from her previous visit.  Review of Systems  Constitutional:   Negative for appetite change, chills, fever and unexpected weight change.  HENT:  Negative for congestion and rhinorrhea.   Eyes:  Negative for itching.  Respiratory:  Negative for cough, chest tightness, shortness of breath and wheezing.   Cardiovascular:  Negative for chest pain.  Gastrointestinal:  Negative for abdominal pain.  Genitourinary:  Negative for difficulty urinating.  Skin:  Negative for rash.  Neurological:  Negative for headaches.    Objective: BP 124/82   Pulse 79   Temp 97.8 F (36.6 C)   Resp 16   Ht 5\' 7"  (1.702 m)   Wt 228 lb 4 oz (103.5 kg)   SpO2 98%   BMI 35.75 kg/m  Body mass index is 35.75 kg/m. Physical Exam Vitals and nursing note reviewed.  Constitutional:      Appearance: Normal appearance. She is  well-developed.  HENT:     Head: Normocephalic and atraumatic.     Right Ear: Tympanic membrane and external ear normal.     Left Ear: Tympanic membrane and external ear normal.     Nose: Nose normal.     Mouth/Throat:     Mouth: Mucous membranes are moist.     Pharynx: Oropharynx is clear.  Eyes:     Conjunctiva/sclera: Conjunctivae normal.  Cardiovascular:     Rate and Rhythm: Normal rate and regular rhythm.     Heart sounds: Normal heart sounds. No murmur heard.    No friction rub. No gallop.  Pulmonary:     Effort: Pulmonary effort is normal.     Breath sounds: Normal breath sounds. No wheezing, rhonchi or rales.  Musculoskeletal:     Cervical back: Neck supple.  Skin:    General: Skin is warm.     Findings: No rash.  Neurological:     Mental Status: She is alert and oriented to person, place, and time.  Psychiatric:        Behavior: Behavior normal.    Previous notes and tests were reviewed. The plan was reviewed with the patient/family, and all questions/concerned were addressed.  It was my pleasure to see Leah Sanchez today and participate in her care. Please feel free to contact me with any questions or concerns.  Sincerely,  Wyline Mood, DO Allergy & Immunology  [facility] office: [phone] [facility] office: [phone]

## 2023-12-13 ENCOUNTER — Other Ambulatory Visit (HOSPITAL_BASED_OUTPATIENT_CLINIC_OR_DEPARTMENT_OTHER): Payer: Self-pay | Admitting: Family Medicine

## 2023-12-13 DIAGNOSIS — Z8249 Family history of ischemic heart disease and other diseases of the circulatory system: Secondary | ICD-10-CM

## 2023-12-29 ENCOUNTER — Encounter (HOSPITAL_BASED_OUTPATIENT_CLINIC_OR_DEPARTMENT_OTHER): Payer: Self-pay

## 2023-12-29 ENCOUNTER — Other Ambulatory Visit (HOSPITAL_BASED_OUTPATIENT_CLINIC_OR_DEPARTMENT_OTHER): Payer: Self-pay
